# Patient Record
Sex: Female | Born: 1975 | Race: White | Hispanic: No | Marital: Married | Smoking: Never smoker
Health system: Southern US, Community
[De-identification: ages and names within clinical notes are randomized; demographics above are authoritative.]

---

## 2002-06-22 ENCOUNTER — Encounter: Payer: Self-pay | Admitting: Emergency Medicine

## 2002-06-22 ENCOUNTER — Observation Stay (HOSPITAL_COMMUNITY): Admission: EM | Admit: 2002-06-22 | Discharge: 2002-06-23 | Payer: Self-pay | Admitting: Emergency Medicine

## 2002-06-24 ENCOUNTER — Emergency Department (HOSPITAL_COMMUNITY): Admission: EM | Admit: 2002-06-24 | Discharge: 2002-06-24 | Payer: Self-pay | Admitting: Emergency Medicine

## 2002-06-25 ENCOUNTER — Encounter: Payer: Self-pay | Admitting: Emergency Medicine

## 2002-06-25 ENCOUNTER — Emergency Department (HOSPITAL_COMMUNITY): Admission: EM | Admit: 2002-06-25 | Discharge: 2002-06-25 | Payer: Self-pay | Admitting: Emergency Medicine

## 2003-12-09 ENCOUNTER — Emergency Department (HOSPITAL_COMMUNITY): Admission: EM | Admit: 2003-12-09 | Discharge: 2003-12-09 | Payer: Self-pay | Admitting: Emergency Medicine

## 2003-12-09 ENCOUNTER — Ambulatory Visit (HOSPITAL_BASED_OUTPATIENT_CLINIC_OR_DEPARTMENT_OTHER): Admission: RE | Admit: 2003-12-09 | Discharge: 2003-12-09 | Payer: Self-pay | Admitting: Urology

## 2004-01-07 ENCOUNTER — Emergency Department (HOSPITAL_COMMUNITY): Admission: EM | Admit: 2004-01-07 | Discharge: 2004-01-07 | Payer: Self-pay | Admitting: *Deleted

## 2004-02-12 IMAGING — CT CT ABDOMEN W/O CM
1 of 2 series · 15 of 32 positions shown, 19 images · non-contrast
Comparison: none

FINDINGS
CLINICAL DATA: PATIENT IS STATUS-POST URETEROSCOPY WITH RT. URETERAL STONE EXTRACTION.  URETERAL
STENT HAD ALSO BEEN PLACED AND WAS SUBSEQUENTLY REMOVED.  THE PATIENT IS COMPLAINING OF PERSISTENT
RT. FLANK PAIN & HEMATURIA.
CT ABDOMEN AND PELVIS WITHOUT CONTRAST
COMPARISON WITH 06/22/02.
NO IV OR ORAL CONTRAST WAS ADMINISTERED.
CT ABDOMEN
THERE IS PERSISTENT MODERATE RIGHT HYDRONEPHROSIS.  STABLE PUNCTATE CALCULUS IN THE MID-PORTION OF
THE RIGHT COLLECTING SYSTEM.  NO OBSTRUCTING URETERAL CALCULUS IS SEEN IN THE ABDOMEN.  LEFT KIDNEY
AND OTHER UNENHANCED APPEARANCE OF THE ABDOMEN IS STABLE AND UNREMARKABLE.
IMPRESSION
RESIDUAL MODERATE RIGHT HYDRONEPHROSIS AND STABLE PUNCTATE CALCULUS IN THE RIGHT KIDNEY.  NO
OBSTRUCTING CALCULUS IS IDENTIFIED IN THE ABDOMEN.
CT PELVIS
PREVIOUSLY NOTED RIGHT URETEROVESICAL JUNCTION CALCULUS HAS BEEN REMOVED, AND NO RESIDUAL
OBSTRUCTING URETERAL CALCULUS IS SEEN.  BLADDER IS MODERATELY DISTENDED.  NO FREE FLUID.
NO RESIDUAL RIGHT URETERAL CALCULUS PRESENT.

[Series 3: kidney sto 5.0 b30f · axial · 0.62mm/px · z∈[-800,-472]mm · 15 of 93 slices shown, 19 images]
[im 7/93  soft-tissue]
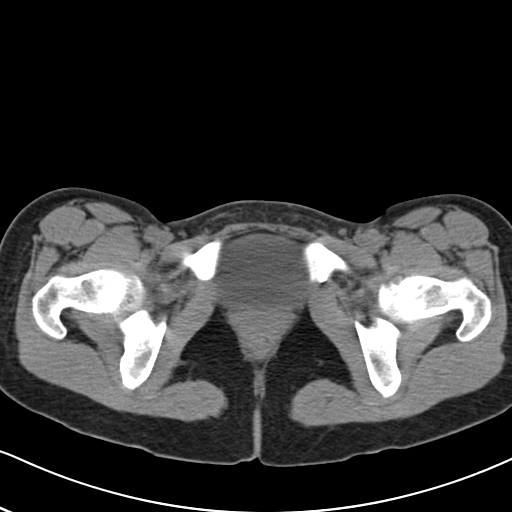
[im 7/93  bone]
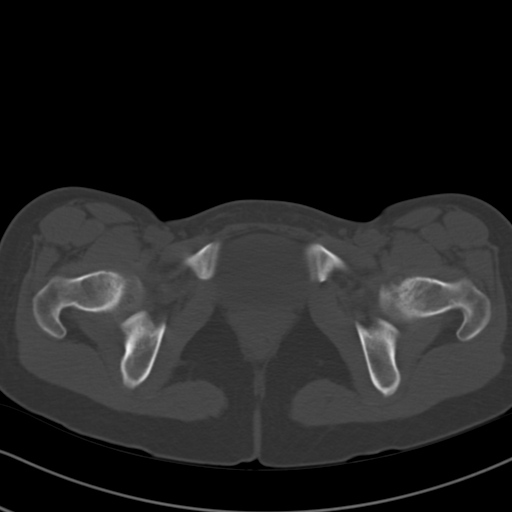
[im 13/93  soft-tissue]
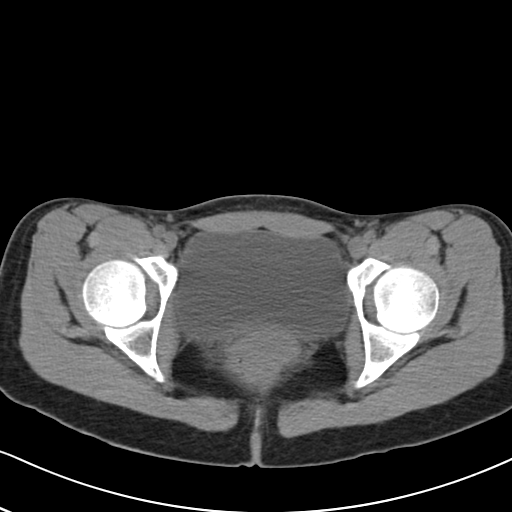
[im 20/93  soft-tissue]
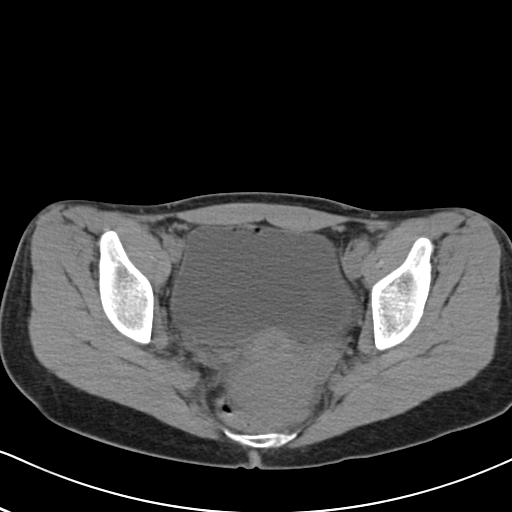
[im 26/93  soft-tissue]
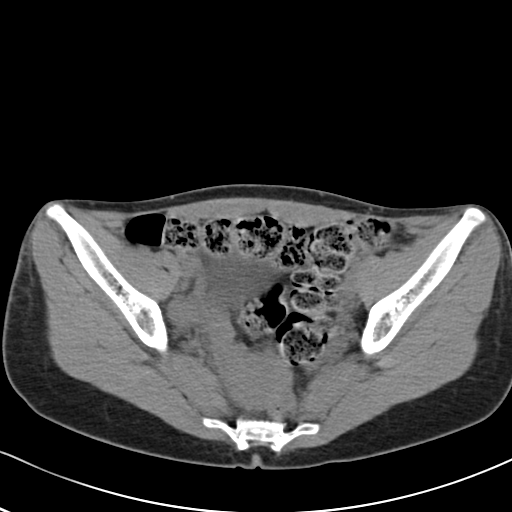
[im 32/93  soft-tissue]
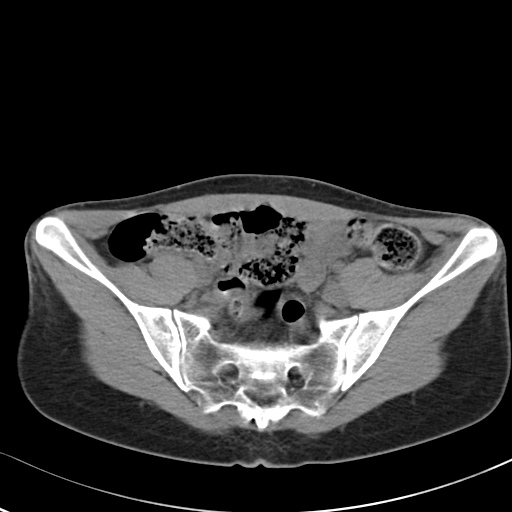
[im 39/93  soft-tissue]
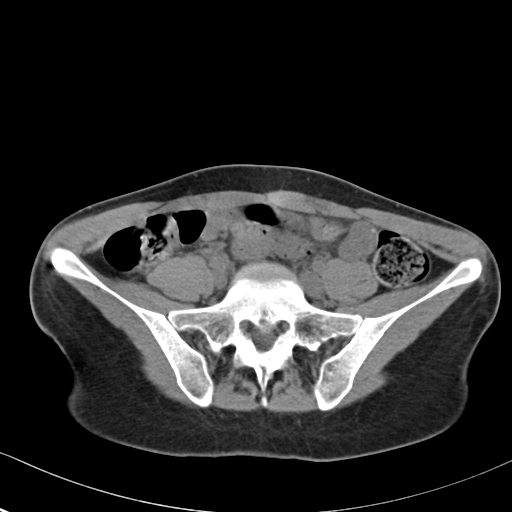
[im 48/93  soft-tissue]
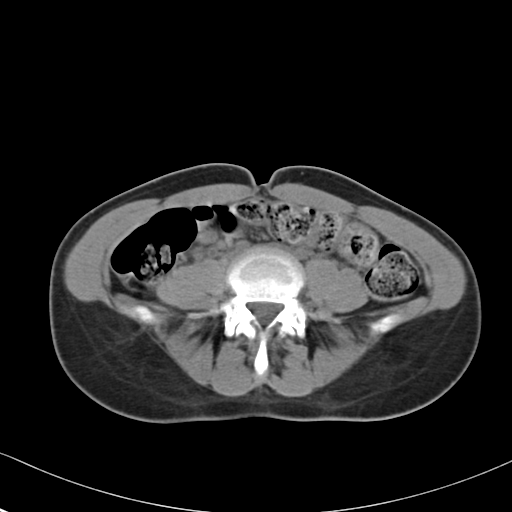
[im 54/93  soft-tissue]
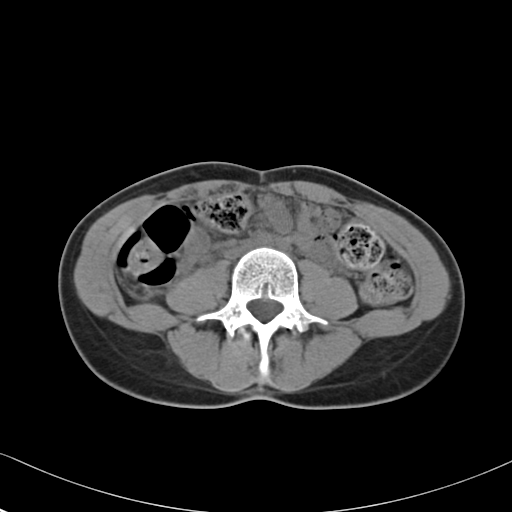
[im 61/93  soft-tissue]
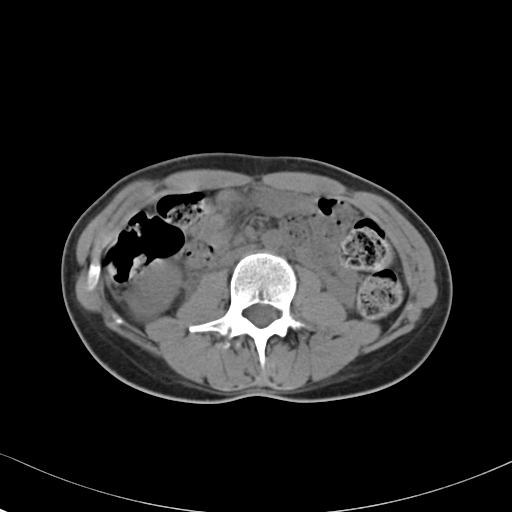
[im 61/93  bone]
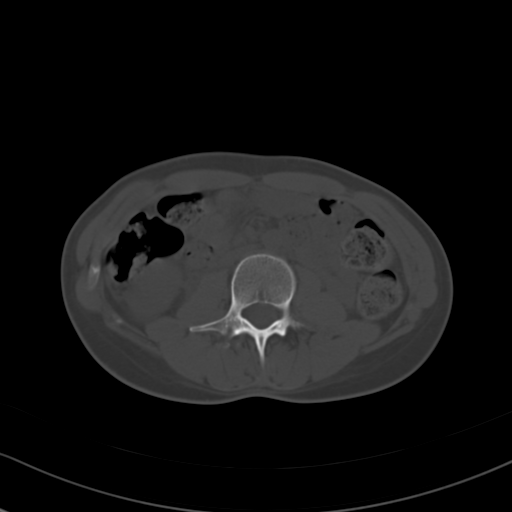
[im 67/93  soft-tissue]
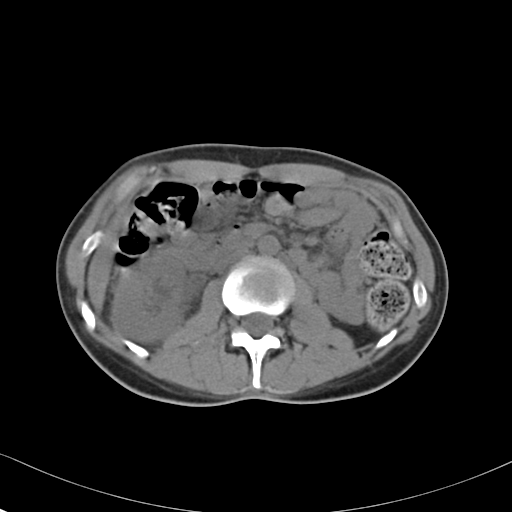
[im 73/93  soft-tissue]
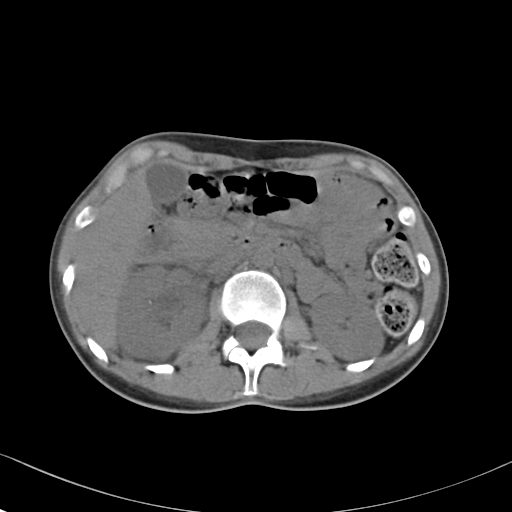
[im 80/93  soft-tissue]
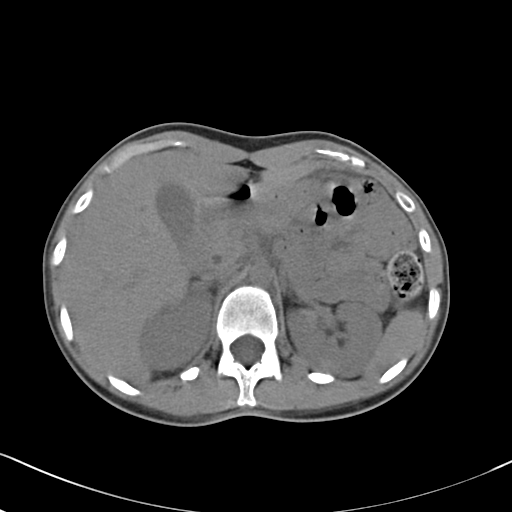
[im 80/93  lung]
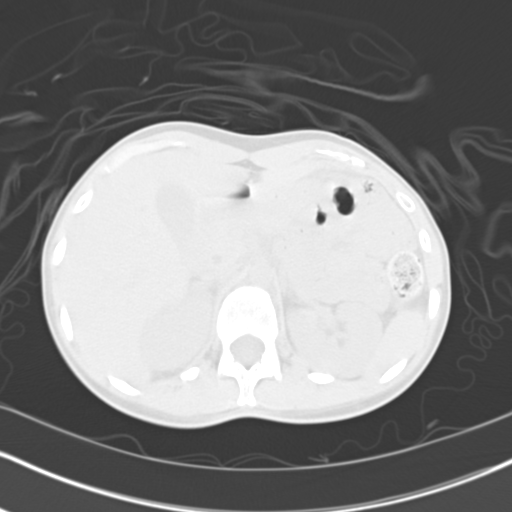
[im 83/93  lung]
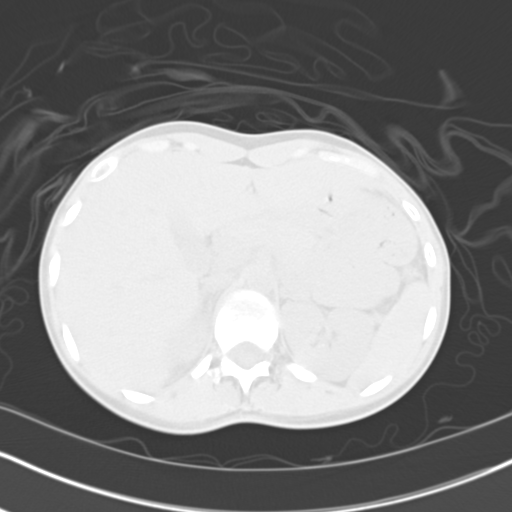
[im 86/93  soft-tissue]
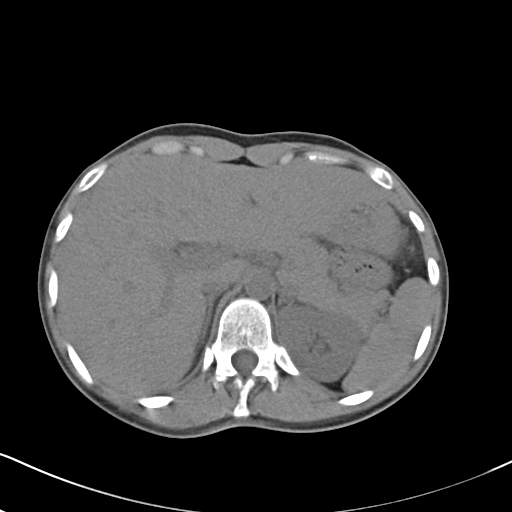
[im 86/93  lung]
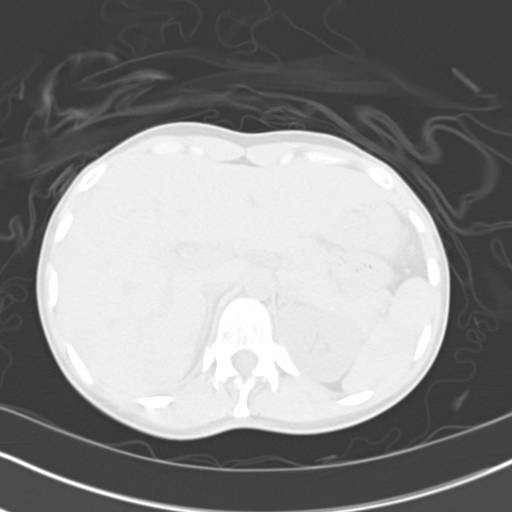
[im 89/93  lung]
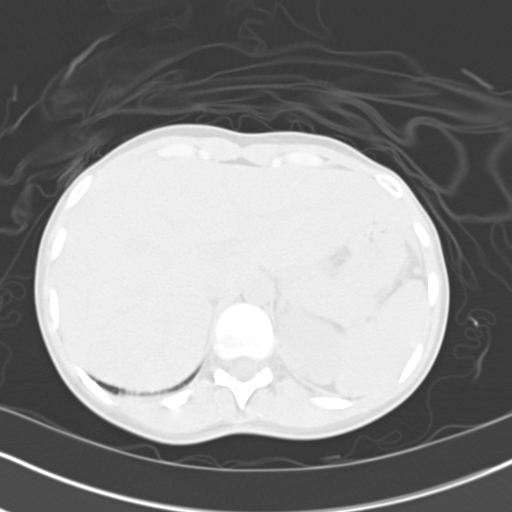

[15 of 32 positions shown; findings below may reference images not displayed]

## 2008-09-02 ENCOUNTER — Other Ambulatory Visit: Admission: RE | Admit: 2008-09-02 | Discharge: 2008-09-02 | Payer: Self-pay | Admitting: Family Medicine

## 2010-10-07 NOTE — Op Note (Signed)
NAME:  Robin Smith, RAVAN                        ACCOUNT NO.:  [id number]   MEDICAL RECORD NO.:  [id number]                   PATIENT TYPE:  OBV   LOCATION:  0373                                 FACILITY:  [facility]   PHYSICIAN:  Jamison Neighbor, M.D.               DATE OF BIRTH:  [birth year]   DATE OF PROCEDURE:  06/22/2002  DATE OF DISCHARGE:                                 OPERATIVE REPORT   PREOPERATIVE DIAGNOSIS:  Right ureteral calculus.   POSTOPERATIVE DIAGNOSIS:  Right ureteral calculus.   PROCEDURES:  1. Cystoscopy.  2. Right retrograde.  3. Right ureteroscopy.  4. Right double J catheter insertion.   SURGEON:  Jamison Neighbor, M.D.   ANESTHESIA:  General.   COMPLICATIONS:  None.   DRAINS:  A 6 French x 24 cm double J catheter.   INTRAOPERATIVE FINDINGS:  Small distal right ureteral calculus that crumbled  and passed spontaneously after passage of ureteroscope.   HISTORY:  This 35 year old female developed severe right-sided pain and  required admission to the hospital for pain management.  She was scheduled  to undergo ureteroscopy earlier today for removal of the stone, but the  patient had eaten breakfast inadvertently.  Her trip to the OR was delayed.  She has had one episode of retention secondary to pain requiring in-and-out  catheterization.  The patient is now to undergo ureteroscopy and basket  extraction.  She understands the risks and benefits of the procedure and  gave full and informed consent.   DESCRIPTION OF PROCEDURE:  After the successful induction of general  anesthesia, the patient was placed in the dorsal lithotomy position and  prepped with Betadine and draped in the usual sterile fashion.  Cystoscopy  was performed, and the urethra was visualized and felt to be normal.  The  bladder neck was unremarkable.  The bladder itself was carefully inspected.  It was free of any tumor or stones.  The left ureteral orifice was  confluently normal.  The  right ureteral orifice had a somewhat kinked-up and  edematous appearance consistent with stone obstructing the ureterovesical  junction.  A ureteral catheter was inserted, and a retrograde study showed  obstruction of the distal right ureter.  A guidewire was passed and the  catheter was then advanced up to the kidney.  Contrast was used to fill the  collecting system.  The guidewire was left in place.  The ureteroscope was  inserted alongside the guidewire.  In the intramural tunnel there was a  tight area which was dilated with the ureteroscope and as that passed  through, the stone essentially crumbled.  The ureteroscope was advanced  through the somewhat hydronephrotic ureter all the way up to and into the  kidney, which was completely normal in its appearance.  There was no other  stone material identified.  There was a question of a 2 mm stone identified  on the  preoperative CT.  This could not be seen with the rigid ureteroscope.  The ureter was visualized in its entirety as the ureteroscope was withdrawn  and the ureter was felt to be otherwise unremarkable.  All the stone  material had washed out.  A double J catheter was passed over the guidewire  and allowed to curl normally in the pelvis.  This was positioned by  positioning the string so that it just coiled within the bladder.  The  bladder was washed out and the stone material that did wash out was too  small to send for a specimen.  The patient tolerated the procedure well, was  taken to the recovery room in good condition.  She will need monitoring in  order to make sure she empties her bladder normally.  If the patient does  not empty well, she will need to be started on Urecholine and will need to  learn in-and-out catheterization.  Let's hope, however, that once the  patient's pain is relieved that she will urinate without difficulty.                                               Jamison Neighbor, M.D.    RJE/MEDQ   D:  06/22/2002  T:  06/23/2002  Job:  [id number]

## 2010-10-07 NOTE — H&P (Signed)
NAME:  Robin Smith, Robin Smith                        ACCOUNT NO.:  [id number]   MEDICAL RECORD NO.:  [id number]                   PATIENT TYPE:  OBV   LOCATION:  0373                                 FACILITY:  [facility]   PHYSICIAN:  Jamison Neighbor, M.D.               DATE OF BIRTH:  Aug 31, 1975   DATE OF ADMISSION:  06/21/2002  DATE OF DISCHARGE:                                HISTORY & PHYSICAL   ADMISSION DIAGNOSIS:  Right ureteral calculus.   HISTORY OF PRESENT ILLNESS:  This 35 year old female has had at least five  episodes of stones over the past four to five years.  The patient went to  the emergency room and on CT was found to have a right ureteral calculus.  The patient had pain that was not controlled with intravenous pain  medication and for that reason was admitted for pain management and  extraction of the stone.   The patient has had no workup of her stones in the past.  She has had one  extraction done in [place] in the distant past but has passed all of her  other stones.   PAST MEDICAL HISTORY:  The patient's only previous surgery was ______  surgery at about age 33.  Her medical history is negative aside from some  chronic problems with anxiety and depression.   MEDICATIONS:  The patient is taking Paxil 40 mg, Prozac 30 mg, Klonopin 1 mg  t.i.d., Imodium on a p.r.n. basis, over-the-counter Benadryl on a p.r.n.  basis, and Macrodantin 100 mg daily.  She is aware of the fact that it is  aware to take both Paxil and Prozac and is planning to change physicians.  The Macrodantin was started recently by a gynecologist in [place]  because of chronic urinary tract infections.   ALLERGIES:  The patient is allergic to SULFA.   FAMILY HISTORY:  Pertinent for mom with rheumatoid arthritis.  Her father  had sleep apnea, elevated cholesterol, and a past history of kidney stones.  One brother suffers from ADD.   SOCIAL HISTORY:  Negative for tobacco and alcohol.   REVIEW  OF SYSTEMS:  Noncontributory other than her problems with anxiety.   PHYSICAL EXAMINATION:  GENERAL:  The patient is a well-developed, well-  nourished, slender white female.  She is slightly confused secondary to the  use of pain medication but is easily arousable.  HEENT:  Normocephalic, atraumatic.  Cranial nerves II-XII were grossly  intact.  CHEST:  The lungs were clear.  CARDIAC:  Regular rate and rhythm with no murmurs, thrills, gallops, rubs,  heaves.  ABDOMEN:  Soft.  There is some right lower quadrant pain and right CVA pain.  EXTREMITIES:  No clubbing, cyanosis, or edema.  GENITOURINARY:  The bladder was felt to be full.  The patient has noted that  since starting on pain medication she has had a hard time urinating.  IMPRESSION:  Right ureteral calculus.   PLAN:  Patient to go to operating room for removal of the stone.                                               Jamison Neighbor, M.D.    RJE/MEDQ  D:  06/22/2002  T:  06/23/2002  Job:  [id number]

## 2010-10-07 NOTE — Consult Note (Signed)
Robin Smith, NEEDS NO.:  [id number]   MEDICAL RECORD NO.:  [id number]                   PATIENT TYPE:  EMS   LOCATION:  ED                                   FACILITY:  [facility]   PHYSICIAN:  Maretta Bees. Vonita Moss, M.D.             DATE OF BIRTH:  08-07-1975   DATE OF CONSULTATION:  DATE OF DISCHARGE:                                   CONSULTATION   I was asked to see this 35 year old white female by Dr. Ignacia Palma because she  has had severe right flank pain due to a 6 x 3 mm distal right ureteral  calculus.  She has had two prior stone manipulations as she has a very  difficult time passing stones and wants pain relief as soon as possible and  also is concerned about traveling to [place] next week with a stone.  I  offered her an option of outpatient observation but she says since she has  actually never passed a stone, she wanted intervention.  She was advised  about cystoscopy, ureteroscopy, possible double J catheter, possible holmium  laser, and risks of bleeding or ureteral injury.   She is in general good health.  She has some chronic anxiety problems, but  no significant medical illnesses.   Besides her stone manipulation she has had wisdom teeth removal.   CURRENT MEDICATIONS:  1. Birth control pills.  2. Klonopin.  3. BuSpar.  4. Zoloft.  5. Prozac.   ALLERGIES:  SULFA.   HABITS:  She does not smoke or drink alcohol.   FAMILY HISTORY:  Noncontributory.   REVIEW OF SYSTEMS:  Noted on healthy history form.   PHYSICAL EXAMINATION:  VITAL SIGNS:  Blood pressure 100/62, pulse 64,  temperature 97.5.  GENERAL:  She is alert and oriented.  SKIN:  Warm and dry.  NECK:  Supple.  LUNGS:  No respiratory distress.  ABDOMEN:  Slightly guarded in the right flank, but otherwise soft and  nontender.   IMPRESSION:  1. Distal right ureteral calculus.  2. Chronic anxiety and depression.   PLAN:  Cystoscopy, ureteroscopy, stone manipulation,  possible holmium laser  fragmentation of stone, possible double J catheter.                                               Maretta Bees. Vonita Moss, M.D.    LJP/MEDQ  D:  12/09/2003  T:  12/09/2003  Job:  [id number]

## 2010-10-07 NOTE — Op Note (Signed)
NAMEHAZEL, Robin Smith                          ACCOUNT NO.:  [id number]   MEDICAL RECORD NO.:  [id number]                   PATIENT TYPE:  AMB   LOCATION:  [facility]                                 FACILITY:  [facility]   PHYSICIAN:  Maretta Bees. Vonita Moss, M.D.             DATE OF BIRTH:  01/28/1976   DATE OF PROCEDURE:  12/09/2003  DATE OF DISCHARGE:                                 OPERATIVE REPORT   PREOPERATIVE DIAGNOSIS:  Distal right ureteral calculus.   POSTOPERATIVE DIAGNOSIS:  Distal right ureteral calculus with urethral  stricture.   PROCEDURE:  Cystoscopy, urethral dilation, right ureteroscopy, right  ureteral stone basketing, and right retrograde pyelogram with  interpretation.   SURGEON:  Maretta Bees. Vonita Moss, M.D.   ANESTHESIA:  General.   INDICATIONS:  This 35 year old white female has had severe right flank pain  and a past history of two stones before that always required ureteroscopy.  She is having a lot of pain, does not tolerate pain well, and has been  unable to pass a stone in the past on her own.  I offered her observation,  but she really wanted to go ahead with stone manipulation and removal of the  stone at this time, which is certainly reasonable.   PROCEDURE:  The patient is brought to the operating room and placed in the  lithotomy position.  External genitalia were prepped and draped in the usual  fashion.  The urethra was snug, so she was dilated 63 [place] before the  cystoscope was inserted.  The bladder was perfectly normal.  A guidewire was  placed up the right ureter without difficulty.  I used the short, rigid  ureteroscope, and there was a tight area just outside the intramural ureter,  which was dilated with the inner dilator of the ureteral access sheath.  I  then was able to insert the scope beyond this slightly tight area to  visualize this golden-yellow stone and remove it in two fragments with the  nitinol stone basket.  One of the smaller fragments  was lost in the bladder  irrigation, but the other larger stone fragment was given to the patient at  the end of the case.  I then reinserted the ureteroscope.  There was no  evidence of residual stones.  I injected contrast, and there was no evidence  of extravasation with prompt drainage from the ureter.  Therefore, the  guidewire was removed.  The bladder emptied.  The cystoscope removed.  The  patient was sent to the recovery room in good condition, having tolerated  the procedure well.                                               Maretta Bees. Vonita Moss, M.D.    LJP/MEDQ  D:  12/09/2003  T:  12/09/2003  Job:  [id number]

## 2019-05-26 ENCOUNTER — Ambulatory Visit: Payer: Self-pay | Attending: Internal Medicine

## 2019-05-26 DIAGNOSIS — Z20822 Contact with and (suspected) exposure to covid-19: Secondary | ICD-10-CM | POA: Insufficient documentation

## 2019-05-27 LAB — NOVEL CORONAVIRUS, NAA: SARS-CoV-2, NAA: NOT DETECTED

## 2022-06-26 ENCOUNTER — Ambulatory Visit: Payer: BC Managed Care – PPO | Admitting: Podiatry

## 2022-06-26 VITALS — BP 103/65 | HR 78

## 2022-06-26 DIAGNOSIS — B070 Plantar wart: Secondary | ICD-10-CM

## 2022-06-26 DIAGNOSIS — B351 Tinea unguium: Secondary | ICD-10-CM | POA: Diagnosis not present

## 2022-06-26 NOTE — Progress Notes (Signed)
   Chief Complaint  Patient presents with   Nail Problem    Hallux nail deformity, left foot plantar wart     Subjective: 47 y.o. female presenting today as a new patient for evaluation of a symptomatic plantar wart to the left forefoot that is been ongoing for about 4 years now.  Patient states that she has tried OTC topical Dr. Felicie Morn wart remover with minimal relief.  She presents for further treatment and evaluation  Patient also complains of discoloration with thickening to the right hallux nail plate that has been ongoing for about 4 years as well.  Initially began with a history of injury.  She has tried some OTC antifungal topical with no improvement.  She presents for further treatment and evaluation   No past medical history on file.   RT hallux nail plate 06/26/2022  Objective: Physical Exam General: The patient is alert and oriented x3 in no acute distress.   Dermatology: Hyperkeratotic skin lesion(s) noted to the plantar aspect of the left foot approximately 1 cm in diameter. Pinpoint bleeding noted upon debridement. Skin is warm, dry and supple bilateral lower extremities. Negative for open lesions or macerations. Hyperkeratotic nail plate with discoloration noted encompassing the sides of the nails to the right hallux.   Vascular: Palpable pedal pulses bilaterally. No edema or erythema noted. Capillary refill within normal limits.   Neurological: Epicritic and protective threshold grossly intact bilaterally.    Musculoskeletal Exam: Pain on palpation to the noted skin lesion(s).  Range of motion within normal limits to all pedal and ankle joints bilateral. Muscle strength 5/5 in all groups bilateral.    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 #2 Excisional debridement of the plantar wart lesion(s) was performed using a chisel blade. Cantharone was applied and the lesion(s)  was dressed with a dry sterile dressing. #3  Today we discussed different treatment options for fungal nail infection.  For now we are going to recommend topical Tolcylen antifungal.  Apply daily #4 return to clinic 3 weeks  Edrick Kins, DPM [facility]  Dr. Edrick Kins, DPM    [address], [place] [address]                Office [phone]  Fax [phone]

## 2022-06-27 ENCOUNTER — Telehealth: Payer: Self-pay | Admitting: Podiatry

## 2022-06-27 NOTE — Telephone Encounter (Signed)
Patient had appointment sched yesterday for plantar wart/nail fungus.   Patient received meds for fungus. But would like instructions on plantar wart.   Please advise

## 2022-06-27 NOTE — Telephone Encounter (Signed)
Cantharone was applied.  She may now wash and shower and get the foot wet.  Cover with a Band-Aid if needed.  Basically leave it alone until his next follow-up appointment.  Please notify patient.  Thanks, Dr. Amalia Hailey

## 2022-07-17 ENCOUNTER — Encounter: Payer: Self-pay | Admitting: Podiatry

## 2022-07-17 ENCOUNTER — Ambulatory Visit: Payer: BC Managed Care – PPO | Admitting: Podiatry

## 2022-07-17 DIAGNOSIS — B351 Tinea unguium: Secondary | ICD-10-CM | POA: Diagnosis not present

## 2022-07-17 DIAGNOSIS — B070 Plantar wart: Secondary | ICD-10-CM | POA: Diagnosis not present

## 2022-07-17 NOTE — Progress Notes (Signed)
   Chief Complaint  Patient presents with   Plantar Warts    Follow up plantar forefoot lesion left   "Its not that much better"   Nail Problem    Follow up hallux right - using topical daily, needs some more    Subjective: 47 y.o. female presenting today for follow-up evaluation of a symptomatic plantar wart to the left forefoot that is been ongoing for about 4 years now.  Patient states that she has tried OTC topical Dr. Felicie Morn wart remover with minimal relief.  Last visit Cantharone applied.  She presents for further treatment and evaluation  Patient has also been applying topical antifungal Tolcylen to the right hallux nail plate.  Presenting for further treatment and evaluation   No past medical history on file.   RT hallux nail plate 06/26/2022  Objective: Physical Exam General: The patient is alert and oriented x3 in no acute distress.   Dermatology: Hyperkeratotic skin lesion(s) noted to the plantar aspect of the left foot approximately 1 cm in diameter. Pinpoint bleeding noted upon debridement. Skin is warm, dry and supple bilateral lower extremities. Negative for open lesions or macerations. Hyperkeratotic nail plate with discoloration noted encompassing the sides of the nails to the right hallux.   Vascular: Palpable pedal pulses bilaterally. No edema or erythema noted. Capillary refill within normal limits.   Neurological: Epicritic and protective threshold grossly intact bilaterally.    Musculoskeletal Exam: There continues to be some tenderness on palpation to the noted skin lesion(s).  Range of motion within normal limits to all pedal and ankle joints bilateral. Muscle strength 5/5 in all groups bilateral.    Assessment: #1 plantar wart left foot; improved #2 fungal nail infection right great toenail plate   Plan of Care:  #1 Patient was evaluated. #2 Excisional debridement of the plantar wart lesion(s) was performed using a chisel blade.  Salicylic acid was  applied and the lesion(s) was dressed with a dry sterile dressing.  Overall significant improvement.  Salicylic acid was provided today to apply daily as tolerated x 4 weeks #3  Continue Tolcylen antifungal to the hallux nail plates.  Apply daily #4 return to clinic 4 weeks  Edrick Kins, DPM [facility]  Dr. Edrick Kins, DPM    [address], [place] [address]                Office [phone]  Fax [phone]

## 2022-08-01 ENCOUNTER — Encounter: Payer: Self-pay | Admitting: Podiatry

## 2022-08-21 ENCOUNTER — Ambulatory Visit: Payer: BC Managed Care – PPO | Admitting: Podiatry

## 2022-09-04 ENCOUNTER — Ambulatory Visit: Payer: BC Managed Care – PPO | Admitting: Podiatry

## 2022-09-04 ENCOUNTER — Encounter: Payer: Self-pay | Admitting: Podiatry

## 2022-09-04 DIAGNOSIS — B070 Plantar wart: Secondary | ICD-10-CM | POA: Diagnosis not present

## 2022-09-04 NOTE — Progress Notes (Signed)
   Chief Complaint  Patient presents with   Plantar Warts    Follow up plantar forefoot lesion left   "I don't really know how its supposed to look"  Follow up nail fungus hallux bilateral   "I guess its doing okay"    Subjective: 47 y.o. female presenting today for follow-up evaluation of a symptomatic plantar wart to the left forefoot that is been ongoing for about 4 years now.  Patient has been applying the salicylic acid as instructed.  She only applies it 2 days in a row every 5 days.  She says daily application is too sensitive.  Patient has also been applying topical antifungal Tolcylen to the right hallux nail plate.  Presenting for further treatment and evaluation   No past medical history on file.   RT hallux nail plate 12/28/8278  Objective: Physical Exam General: The patient is alert and oriented x3 in no acute distress.   Dermatology: Overall there does appear to be some improvement.  Hyperkeratotic skin lesion(s) noted to the plantar aspect of the left foot approximately 1 cm in diameter. Pinpoint bleeding noted upon debridement. Skin is warm, dry and supple bilateral lower extremities. Negative for open lesions or macerations. Hyperkeratotic nail plate with discoloration noted encompassing the sides of the nails to the right hallux.   Vascular: Palpable pedal pulses bilaterally. No edema or erythema noted. Capillary refill within normal limits.   Neurological: Epicritic and protective threshold grossly intact bilaterally.    Musculoskeletal Exam: There continues to be some tenderness on palpation to the noted skin lesion(s).  Range of motion within normal limits to all pedal and ankle joints bilateral. Muscle strength 5/5 in all groups bilateral.    Assessment: #1 plantar wart left foot; improved #2 fungal nail infection right great toenail plate   Plan of Care:  #1 Patient was evaluated.  Overall there does appear to be some improvement #2  Repeat excisional  debridement of the plantar wart lesion(s) was performed using a chisel blade.  Salicylic acid was applied and the lesion(s) was dressed with a dry sterile dressing.  Overall significant improvement.  Salicylic acid was provided today to apply daily as tolerated x  weeks #3  Continue Tolcylen antifungal to the hallux nail plates.  Apply daily #4 return to clinic 4 weeks  Felecia Shelling, DPM [facility]  Dr. Felecia Shelling, DPM    [address] [place], [place] [address]                Office [phone]  Fax [phone]

## 2022-10-18 ENCOUNTER — Ambulatory Visit: Payer: BC Managed Care – PPO | Admitting: Podiatry

## 2022-10-18 DIAGNOSIS — B070 Plantar wart: Secondary | ICD-10-CM

## 2022-10-18 NOTE — Progress Notes (Signed)
   Chief Complaint  Patient presents with   Nail Problem    Nail fungus, [place]: tyoclen     Subjective: 47 y.o. female presenting today for follow-up evaluation of a symptomatic plantar wart to the left forefoot that is been ongoing for about 4 years now.  Patient states that with the last application of the salicylic acid the plantar warts to the left foot resolved completely.  She says that they fell off.  She is very satisfied with the results.  Patient also continues to apply the topical Tolcylen daily to the nail plate.   No past medical history on file.   RT hallux nail plate 05/27/1094  Objective: Physical Exam General: The patient is alert and oriented x3 in no acute distress.   Dermatology: Continued improvement of the appearance and health of the nail plate.  The hyperkeratotic verruca lesions to the plantar aspect of the foot have resolved.  Healthy skin and tissue noted.    Vascular: Palpable pedal pulses bilaterally. No edema or erythema noted. Capillary refill within normal limits.   Neurological: Epicritic and protective threshold grossly intact bilaterally.    Musculoskeletal Exam: No gross pedal deformity noted.  No tenderness throughout palpation of the forefoot.   Assessment: #1 plantar wart left foot; resolved #2 fungal nail infection right great toenail plate   Plan of Care:  -Patient was evaluated.   -Light debridement of the tissue of the plantar forefoot was performed today using a tissue nipper. -Patient may discontinue salicylic acid.  Reapply as needed -Continue topical Tolcylen antifungal daily. -Return to clinic as needed  Felecia Shelling, DPM [facility]  Dr. Felecia Shelling, DPM    [address], [place] [address]                Office [phone]  Fax [phone]

## 2023-04-04 ENCOUNTER — Other Ambulatory Visit: Payer: Self-pay | Admitting: Nurse Practitioner

## 2023-04-04 DIAGNOSIS — Z9189 Other specified personal risk factors, not elsewhere classified: Secondary | ICD-10-CM

## 2023-04-04 DIAGNOSIS — Z803 Family history of malignant neoplasm of breast: Secondary | ICD-10-CM

## 2023-04-04 DIAGNOSIS — R92333 Mammographic heterogeneous density, bilateral breasts: Secondary | ICD-10-CM

## 2023-05-25 ENCOUNTER — Encounter: Payer: Self-pay | Admitting: Nurse Practitioner

## 2023-05-29 ENCOUNTER — Ambulatory Visit
Admission: RE | Admit: 2023-05-29 | Discharge: 2023-05-29 | Disposition: A | Discharge status: Home / Self Care | Payer: Self-pay | Source: Ambulatory Visit | Attending: Nurse Practitioner | Admitting: Nurse Practitioner

## 2023-05-29 DIAGNOSIS — R92333 Mammographic heterogeneous density, bilateral breasts: Secondary | ICD-10-CM

## 2023-05-29 DIAGNOSIS — Z9189 Other specified personal risk factors, not elsewhere classified: Secondary | ICD-10-CM

## 2023-05-29 DIAGNOSIS — Z803 Family history of malignant neoplasm of breast: Secondary | ICD-10-CM

## 2023-05-29 MED ORDER — GADOPICLENOL 0.5 MMOL/ML IV SOLN
7.0000 mL | Freq: Once | INTRAVENOUS | Status: AC | PRN
  Administered 2023-05-29: 7 mL via INTRAVENOUS
